# Patient Record
Sex: Female | Born: 1995 | Race: Black or African American | Hispanic: No | Marital: Single | Smoking: Never smoker
Health system: Southern US, Community
[De-identification: ages and names within clinical notes are randomized; demographics above are authoritative.]

## PROBLEM LIST (undated history)

## (undated) DIAGNOSIS — Q185 Microstomia: Secondary | ICD-10-CM

## (undated) DIAGNOSIS — R198 Other specified symptoms and signs involving the digestive system and abdomen: Secondary | ICD-10-CM

## (undated) DIAGNOSIS — L0591 Pilonidal cyst without abscess: Secondary | ICD-10-CM

## (undated) DIAGNOSIS — T4145XA Adverse effect of unspecified anesthetic, initial encounter: Secondary | ICD-10-CM

## (undated) DIAGNOSIS — Z87898 Personal history of other specified conditions: Secondary | ICD-10-CM

## (undated) DIAGNOSIS — R1310 Dysphagia, unspecified: Secondary | ICD-10-CM

## (undated) DIAGNOSIS — T8859XA Other complications of anesthesia, initial encounter: Secondary | ICD-10-CM

## (undated) DIAGNOSIS — Z8489 Family history of other specified conditions: Secondary | ICD-10-CM

## (undated) HISTORY — PX: TYMPANOSTOMY TUBE PLACEMENT: SHX32

## (undated) HISTORY — PX: UVULECTOMY: SHX2631

---
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
## 2018-08-09 ENCOUNTER — Other Ambulatory Visit: Payer: Self-pay | Admitting: Family Medicine

## 2018-08-09 DIAGNOSIS — R109 Unspecified abdominal pain: Secondary | ICD-10-CM

## 2018-08-19 ENCOUNTER — Ambulatory Visit
Admission: RE | Admit: 2018-08-19 | Discharge: 2018-08-19 | Disposition: A | Discharge status: Home / Self Care | Payer: Managed Care, Other (non HMO) | Source: Ambulatory Visit | Attending: Family Medicine | Admitting: Family Medicine

## 2018-08-19 DIAGNOSIS — R109 Unspecified abdominal pain: Secondary | ICD-10-CM

## 2018-08-23 ENCOUNTER — Other Ambulatory Visit (HOSPITAL_COMMUNITY): Payer: Self-pay | Admitting: Surgery

## 2018-08-23 DIAGNOSIS — R1013 Epigastric pain: Secondary | ICD-10-CM

## 2018-09-05 ENCOUNTER — Encounter (HOSPITAL_COMMUNITY): Payer: Managed Care, Other (non HMO)

## 2018-09-05 ENCOUNTER — Ambulatory Visit (HOSPITAL_COMMUNITY)
Admission: RE | Admit: 2018-09-05 | Discharge: 2018-09-05 | Disposition: A | Discharge status: Home / Self Care | Payer: Managed Care, Other (non HMO) | Source: Ambulatory Visit | Attending: Surgery | Admitting: Surgery

## 2018-09-05 DIAGNOSIS — R1013 Epigastric pain: Secondary | ICD-10-CM

## 2018-09-05 DIAGNOSIS — R748 Abnormal levels of other serum enzymes: Secondary | ICD-10-CM | POA: Diagnosis present

## 2018-09-05 MED ORDER — SODIUM CHLORIDE 0.9% FLUSH
INTRAVENOUS | Status: AC
  Filled 2018-09-05: qty 150

## 2018-09-05 MED ORDER — TECHNETIUM TC 99M MEBROFENIN IV KIT
5.0000 | PACK | Freq: Once | INTRAVENOUS | Status: AC | PRN
  Administered 2018-09-05: 5.1 via INTRAVENOUS

## 2018-10-11 ENCOUNTER — Other Ambulatory Visit: Payer: Self-pay | Admitting: Gastroenterology

## 2018-10-11 DIAGNOSIS — R7989 Other specified abnormal findings of blood chemistry: Secondary | ICD-10-CM

## 2018-10-11 DIAGNOSIS — R945 Abnormal results of liver function studies: Secondary | ICD-10-CM

## 2018-10-26 ENCOUNTER — Other Ambulatory Visit: Payer: Self-pay | Admitting: Gastroenterology

## 2018-10-26 ENCOUNTER — Ambulatory Visit
Admission: RE | Admit: 2018-10-26 | Discharge: 2018-10-26 | Disposition: A | Discharge status: Home / Self Care | Payer: Self-pay | Source: Ambulatory Visit | Attending: Gastroenterology | Admitting: Gastroenterology

## 2018-10-26 DIAGNOSIS — R7989 Other specified abnormal findings of blood chemistry: Secondary | ICD-10-CM

## 2018-10-26 DIAGNOSIS — R945 Abnormal results of liver function studies: Secondary | ICD-10-CM

## 2018-10-26 MED ORDER — GADOBENATE DIMEGLUMINE 529 MG/ML IV SOLN: 13.0000 mL | Freq: Once | INTRAVENOUS | Status: DC | PRN

## 2018-10-26 MED ORDER — GADOBENATE DIMEGLUMINE 529 MG/ML IV SOLN
13.0000 mL | Freq: Once | INTRAVENOUS | Status: AC | PRN
  Administered 2018-10-26: 13 mL via INTRAVENOUS

## 2018-12-11 ENCOUNTER — Ambulatory Visit (INDEPENDENT_AMBULATORY_CARE_PROVIDER_SITE_OTHER): Payer: 59 | Admitting: Psychology

## 2018-12-11 DIAGNOSIS — F812 Mathematics disorder: Secondary | ICD-10-CM | POA: Diagnosis not present

## 2018-12-11 DIAGNOSIS — F4010 Social phobia, unspecified: Secondary | ICD-10-CM | POA: Diagnosis not present

## 2018-12-17 ENCOUNTER — Ambulatory Visit (INDEPENDENT_AMBULATORY_CARE_PROVIDER_SITE_OTHER): Payer: 59 | Admitting: Psychology

## 2018-12-17 DIAGNOSIS — F812 Mathematics disorder: Secondary | ICD-10-CM

## 2018-12-17 DIAGNOSIS — F4010 Social phobia, unspecified: Secondary | ICD-10-CM

## 2019-01-07 ENCOUNTER — Ambulatory Visit (INDEPENDENT_AMBULATORY_CARE_PROVIDER_SITE_OTHER): Payer: 59 | Admitting: Psychology

## 2019-01-07 DIAGNOSIS — F812 Mathematics disorder: Secondary | ICD-10-CM | POA: Diagnosis not present

## 2019-01-07 DIAGNOSIS — F840 Autistic disorder: Secondary | ICD-10-CM | POA: Diagnosis not present

## 2019-02-24 ENCOUNTER — Ambulatory Visit (INDEPENDENT_AMBULATORY_CARE_PROVIDER_SITE_OTHER): Payer: 59 | Admitting: Psychology

## 2019-02-24 DIAGNOSIS — F840 Autistic disorder: Secondary | ICD-10-CM

## 2019-02-24 DIAGNOSIS — F812 Mathematics disorder: Secondary | ICD-10-CM

## 2019-05-29 IMAGING — NM NM HEPATO W/GB/PHARM/[PERSON_NAME]
2 series · 12 of 12 positions shown · non-contrast
Comparison: None.

CLINICAL DATA: Nausea and vomiting for a month.

EXAM:
NUCLEAR MEDICINE HEPATOBILIARY IMAGING WITH GALLBLADDER EF
TECHNIQUE: Sequential images of the abdomen were obtained [DATE] minutes
following intravenous administration of radiopharmaceutical. After
oral ingestion of Ensure, gallbladder ejection fraction was
determined. At 60 min, normal ejection fraction is greater than 33%.
RADIOPHARMACEUTICALS:  5.1 mCi 5c-44m  Choletec IV

[Series 1: biliary · 3.25mm/px · 6 of 60 frames shown]
[frame 6/60]
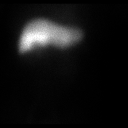
[frame 16/60]
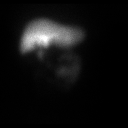
[frame 26/60]
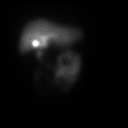
[frame 36/60]
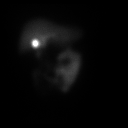
[frame 46/60]
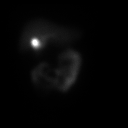
[frame 56/60]
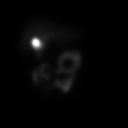

[Series 2: gbef · 3.25mm/px · 6 of 60 frames shown]
[frame 6/60]
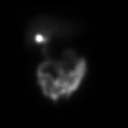
[frame 16/60]
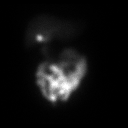
[frame 26/60]
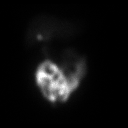
[frame 36/60]
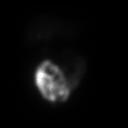
[frame 46/60]
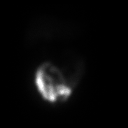
[frame 56/60]
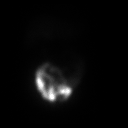

[12 of 12 positions shown; findings below may reference images not displayed]

FINDINGS: Prompt uptake and biliary excretion of activity by the liver is
seen. Gallbladder activity is visualized, consistent with patency of
cystic duct. Biliary activity passes into small bowel, consistent
with patent common bile duct.

Calculated gallbladder ejection fraction is 99%. (Normal gallbladder
ejection fraction with Ensure is greater than 33%.)
IMPRESSION: Normal gallbladder ejection fraction. No cause for nausea
identified.

## 2020-07-27 ENCOUNTER — Other Ambulatory Visit: Payer: Self-pay | Admitting: Radiology

## 2020-07-27 ENCOUNTER — Other Ambulatory Visit: Payer: 59

## 2020-07-27 DIAGNOSIS — Z20822 Contact with and (suspected) exposure to covid-19: Secondary | ICD-10-CM

## 2020-07-29 LAB — SARS-COV-2, NAA 2 DAY TAT

## 2020-07-29 LAB — NOVEL CORONAVIRUS, NAA: SARS-CoV-2, NAA: NOT DETECTED

## 2020-08-02 ENCOUNTER — Telehealth: Payer: Self-pay | Admitting: General Practice

## 2020-08-02 NOTE — Telephone Encounter (Signed)
Negative COVID results given. Patient results "NOT Detected." Caller expressed understanding. ° °

## 2024-04-11 ENCOUNTER — Ambulatory Visit (INDEPENDENT_AMBULATORY_CARE_PROVIDER_SITE_OTHER): Admitting: Psychology

## 2024-04-11 ENCOUNTER — Encounter: Payer: Self-pay | Admitting: Psychology

## 2024-04-11 DIAGNOSIS — F840 Autistic disorder: Secondary | ICD-10-CM | POA: Diagnosis not present

## 2024-04-11 DIAGNOSIS — F411 Generalized anxiety disorder: Secondary | ICD-10-CM | POA: Diagnosis not present

## 2024-04-11 NOTE — Progress Notes (Signed)
 [facility] Counselor Initial Adult Exam  Name: Joan Henry Date: 04/11/2024 MRN: [id number] DOB: May 23, 1996 PCP: Romaine Closs, MD (Inactive)  Time spent: 3:00 - 3:50 pm  Guardian/Informant:  Joan Henry - Patient    Paperwork requested: No  Met with patient for initial interview.  Patient was at home and session was conducted from therapist's office via video conferencing.  Patient expressed awareness of the limitations related to video sessions and verbally consented to telehealth.    Reason for Visit /Presenting Problem: Patient seeking testing and or therapy related to anxiety.  She was previously evaluated by this provider in 2020 and  diagnosed with Autism spectrum disorder (ASD).  She reported becoming highly anxious in crowds.  She was working and going to school part-time but is now not working and is enrolled in school full-time.   Mental Status Exam: Appearance:   Neat and Well Groomed     Behavior:  Appropriate and Rigid  Motor:  Normal  Speech/Language:   Clear and Coherent and Normal Rate  Affect:  Constricted and Flat  Mood:  euthymic  Thought process:  normal  Thought content:    WNL  Sensory/Perceptual disturbances:    WNL  Orientation:  oriented to person, place, time/date, and situation  Attention:  Good  Concentration:  Good  Memory:  WNL  Fund of knowledge:   Good  Insight:    Fair  Judgment:   Good  Impulse Control:  Good   Developmental History: Early delays - Developmental milestones were reported to be within age appropriate limits for speech acquisition and gross motor skills.  Delays in fine-motor skills were reported included difficulty with shoe tying, buttoning, and extra small handwriting.  Initial self-help skills were reported to have developed on time, but [name] was reported to have difficulty keeping up with hygiene and independence through her teen years.   Social-emotional development was also reported to be delayed, as it  was reported that [name] was typically introverted and preferred to socialize with younger children.   Motor - Good coordination.  No sports or physical activity.  Good fine motor. Speech - speaks clearly and is understood by others for the most part.   Self Care - Adequate overall but sleep pattern is inconsistent.   Independent - Not driving or paying bills but can pay bills independently. Social - No friends or close relationships outside of family.  Can get along with peers adequately but feels like she needs to shout or get point across with parents.   Reported Symptoms: Sometimes feels like having a panic when trying to sleep. No recent changes in appetite.  Energy is inconsistent.  Some days lays in bed all day while other days is more active.  No prolonged per patient, although mother suspects that patient has been depressed at times.  Sudden anxiety attacks at night.  Come randomly without triggers.  More frequent at night.  Excessive worry/fear about school (upcoming tests and assignments).  Has general worry and social anxiety.  No intrusive thought. No compulsive behavior.  No mania.  Some trouble paying attention but better than previously.  Some distractibility.  Frequent losing and forgetting.  Adequately organized for the most part.  No restlessness/fidgeting. Some verbal impulsivity.  No impulsive behavior.  Gets along adequately with peers but no close relationships.  Adequate interpreting nonverbal skills, can get jokes or sarcasm.  No repetitive speech or behavior or overly intense interests.  Can handle change adequately.  Sensitive to certain noises, will trigger her anxiety.              Risk Assessment: Danger to Self:  No Self-injurious Behavior: No Danger to Others: No Duty to Warn:no Physical Aggression / Violence:Occasional physical aggression.  Access to Firearms a concern: No  Gang Involvement:No  Patient / guardian was educated about steps to take if suicide or  homicide risk level increases between visits: n/a While future psychiatric events cannot be accurately predicted, the patient does not currently require acute inpatient psychiatric care and does not currently meet [place]  involuntary commitment criteria.  Substance Abuse History: Current substance abuse: No     Past Psychiatric History:   Previous psychological history is significant for autism and learning disability Outpatient Providers:No counseling/therapy currently or in the past.  No psychiatry.   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 Victim of Neglect:No. Perpetrator of None  Witness / Exposure to Domestic Violence: No   Protective Services Involvement: No  Witness to [employer] Violence:  No   Family History:  Family History  Problem Relation Age of Onset   Hypertension Maternal Grandmother    Hypertension Maternal Grandfather    Heart disease Maternal Grandfather    Hypertension Paternal Grandmother    Anesthesia problems Mother        wakes up shaking    Living situation: the patient lives with their family (parents and sister 19 years).  Good family relations mostly.  Parents and sister will make statements that trigger her anger and she feels like she needs to retaliate.    Sexual Orientation: Straight  Relationship Status: single  Name of spouse / other:No interest in dating currently If a parent, number of children / ages:None  Support Systems: parents  Financial Stress:  No   Income/Employment/Disability: Supported by [employer] and [employer] while attending school full-time.  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  Performed well at the job.  Left the job to focus full-time on school.     Military Service: No   Educational  History: Education: student current.  Has associates degree from [employer] in 2022 and will be earning Bachelor's degree in Child and family relations next May from [employer]   Performing well academically per patient.  Receives accommodations at school currently just extra time for tests.     Religion/Sprituality/World View: Christian  Any cultural differences that may affect / interfere with treatment:  Black/AA  Recreation/Hobbies: reading, drawing, listening to music, playing video games.    Stressors: Educational concerns   - currently on summer break.  Stressed about trying to change concentration for major.  Typically worries about assignments and tests.  Strengths: Chief Financial Officer.    Barriers:  Anxiety - panic and worry   Legal History: Pending legal issue / charges: The patient has no significant history of legal issues.  Medical History/Surgical History: reviewed Past Medical History:  Diagnosis Date   Abnormally small mouth    Complication of anesthesia    states wakes up shaking   Difficulty swallowing pills    Family history of adverse reaction to anesthesia    pt's mother has hx. of waking up shaking after anesthesia   History of febrile seizure  x 1 at age 72 months   Infected pilonidal cyst 08/2015  No current medical issues  Past Surgical History:  Procedure Laterality Date   INCISION AND DRAINAGE ABSCESS  07/28/2015   pilonidal cyst   PILONIDAL CYST EXCISION N/A 08/25/2015   Procedure: INCISION AND DRAINAGE ABSCESS PILONIDAL CYST PEDIATRIC;  Surgeon: Alanda Allegra, MD;  Location:  SURGERY CENTER;  Service: Pediatrics;  Laterality: N/A;   TOOTH EXTRACTION  11/11/2008   #19   TYMPANOPLASTY WITH GRAFT Left 11/11/2008   TYMPANOSTOMY TUBE PLACEMENT Bilateral    x 2   UVULECTOMY     WISDOM TOOTH EXTRACTION  06/2015    Medications: Current Outpatient Medications  Medication Sig Dispense Refill    sulfamethoxazole -trimethoprim  (BACTRIM ,SEPTRA ) 400-80 MG tablet Take 1 tablet by mouth 2 (two) times daily.     No current facility-administered medications for this visit.  No current medications  Allergies  Allergen Reactions   Codeine Other (See Comments)    GI UPSET   Zithromax [Azithromycin] Hives  Seasonal allergies no digestion problems.  No concussions seizures or head injuries.     Diagnoses:  Generalized anxiety disorder  Autism spectrum disorder  Plan of Care: Patient presents with significant anxiety, especially in relations to school or social situations, although she tends to worry in general.  Her worry/stress tends to build up over the course of the day to where she often has panic attacks at night.  She was previously evaluated by this provider and diagnosed with autism spectrum disorder in 2020 but is now seeking psychotherapy to help manage her anxiety.         Goal: Better manage anxiety   Objective: Patient to use deep breathing exercise and other physical calming methods to lessen the physical intensity of anxiety/panic symptoms during at least 80% of the instances. Target date - 11/19/24 Progress - 5  Objective: Patient to notice thoughts that are either exaggerated, unlikely, or out of patients control, during at least 80% of the instances.   Target date - 11/19/24 Progress - 0    Intervention methods: CBT, Mindfulness, ACT, psychoeducation about Autism.   Paizlie Klaus, PhD

## 2024-04-11 NOTE — Progress Notes (Signed)
   Joan Dames, PhD

## 2024-06-13 ENCOUNTER — Encounter: Payer: Self-pay | Admitting: Psychology

## 2024-06-13 ENCOUNTER — Ambulatory Visit: Admitting: Psychology

## 2024-06-13 DIAGNOSIS — F411 Generalized anxiety disorder: Secondary | ICD-10-CM

## 2024-06-13 DIAGNOSIS — F840 Autistic disorder: Secondary | ICD-10-CM

## 2024-06-13 NOTE — Progress Notes (Signed)
 [facility] Counselor/Therapist Progress Note Name: DASHONDA BONNEAU Date: 06/13/2024 MRN: [id number] DOB: 1996-04-17 PCP: Morgan Perl, MD (Inactive)  Time spent: 10:30 - 11:20 pm  Guardian/Informant:  Cleora Domino - Patient    Paperwork requested: No  Met with patient for therapy session.  Patient was at home and session was conducted from therapist's office via video conferencing.  Patient expressed awareness of the limitations related to video sessions and verbally consented to telehealth.    Reason for Visit /Presenting Problem: Patient seeking testing and or therapy related to anxiety.  She was previously evaluated by this provider in 2020 and  diagnosed with Autism spectrum disorder (ASD).  She reported becoming highly anxious in crowds.  She was working and going to school part-time but is now not working and is enrolled in school full-time.   Mental Status Exam: Appearance:   Neat and Well Groomed     Behavior:  Appropriate and Rigid  Motor:  Normal  Speech/Language:   Clear and Coherent and Normal Rate  Affect:  Constricted and Flat  Mood:  euthymic  Thought process:  normal  Thought content:    WNL  Sensory/Perceptual disturbances:    WNL  Orientation:  oriented to person, place, time/date, and situation  Attention:  Good  Concentration:  Good  Memory:  WNL  Fund of knowledge:   Good  Insight:    Fair  Judgment:   Good  Impulse Control:  Good             Risk Assessment: Danger to Self:  No Self-injurious Behavior: No Danger to Others: No Duty to Warn:no Physical Aggression / Violence:Occasional physical aggression.  Access to Firearms a concern: No  Gang Involvement:No  Patient / guardian was educated about steps to take if suicide or homicide risk level increases between visits: n/a While future psychiatric events cannot be accurately predicted, the patient does not currently require acute inpatient psychiatric care and does not currently meet [facility] involuntary commitment criteria.  Subjective: Patient reported having much anxiety which she cannot control. The anxiety feels like a tightening in her chest with irregular heartbeat which leads to he feeling overwhelmed and either unable to act or avoiding the situation.  She reported feeling triggered by either having too many activities to do at once, doing large projects, having conflicting obligations, and being in large crowds.      Interventions: Specific intervention focused on using deep breathing and physical activity for calming, along with noticing exaggerated thoughts, breaking tasks into small segments, prioritizing her activities, and asking for assistance when needed. Relaxation and CBT techniques were used.        Assessment: Patient was interactive and appeared motivated to practice the techniques discussed.  She expressed being hesitant to ask for assistance so that will need to be addressed.   Diagnoses:  Generalized anxiety disorder  Autism spectrum disorder  Plan of Care: Patient presents with significant anxiety, especially in relations to school or social situations, although she tends to worry in general.  Her worry/stress tends to build up over the course of the day to where she often has panic attacks at night.  She was previously evaluated by this provider and diagnosed with autism spectrum disorder in 2020 but is now seeking psychotherapy to help manage her anxiety.         Goal: Better manage anxiety   Objective: Patient to use deep breathing exercise and other physical calming methods to lessen the physical  intensity of anxiety/panic symptoms during at least 80% of the instances. Target date - 11/19/24 Progress - 10  Objective: Patient to notice thoughts that are either exaggerated, unlikely, or out of patients control, during at least 80% of the instances.   Target date - 11/19/24 Progress - 5    Intervention methods: CBT, Mindfulness, ACT,  psychoeducation about Autism.   Kalel Harty, PhD
